# Patient Record
Sex: Male | Born: 1980 | Marital: Single | Smoking: Never smoker
Health system: Southern US, Community
[De-identification: ages and names within clinical notes are randomized; demographics above are authoritative.]

## PROBLEM LIST (undated history)

## (undated) DIAGNOSIS — I10 Essential (primary) hypertension: Secondary | ICD-10-CM

## (undated) DIAGNOSIS — R443 Hallucinations, unspecified: Secondary | ICD-10-CM

## (undated) DIAGNOSIS — F609 Personality disorder, unspecified: Secondary | ICD-10-CM

## (undated) DIAGNOSIS — G4700 Insomnia, unspecified: Secondary | ICD-10-CM

## (undated) DIAGNOSIS — R440 Auditory hallucinations: Secondary | ICD-10-CM

## (undated) DIAGNOSIS — F250 Schizoaffective disorder, bipolar type: Secondary | ICD-10-CM

## (undated) DIAGNOSIS — F29 Unspecified psychosis not due to a substance or known physiological condition: Secondary | ICD-10-CM

## (undated) DIAGNOSIS — R4586 Emotional lability: Secondary | ICD-10-CM

## (undated) DIAGNOSIS — E669 Obesity, unspecified: Secondary | ICD-10-CM

## (undated) DIAGNOSIS — E785 Hyperlipidemia, unspecified: Secondary | ICD-10-CM

## (undated) DIAGNOSIS — Z7289 Other problems related to lifestyle: Secondary | ICD-10-CM

## (undated) HISTORY — DX: Obesity, unspecified: E66.9

## (undated) HISTORY — DX: Schizoaffective disorder, bipolar type: F25.0

## (undated) HISTORY — DX: Hyperlipidemia, unspecified: E78.5

## (undated) HISTORY — DX: Emotional lability: R45.86

## (undated) HISTORY — DX: Personality disorder, unspecified: F60.9

## (undated) HISTORY — DX: Auditory hallucinations: R44.0

## (undated) HISTORY — DX: Essential (primary) hypertension: I10

## (undated) HISTORY — DX: Insomnia, unspecified: G47.00

## (undated) HISTORY — DX: Unspecified psychosis not due to a substance or known physiological condition: F29

## (undated) HISTORY — DX: Hallucinations, unspecified: R44.3

## (undated) HISTORY — DX: Other problems related to lifestyle: Z72.89

---

## 2003-10-26 HISTORY — PX: CIRCUMCISION: SUR203

## 2012-01-01 ENCOUNTER — Emergency Department: Payer: Self-pay | Admitting: Internal Medicine

## 2012-01-01 LAB — COMPREHENSIVE METABOLIC PANEL
Albumin: 3.9 g/dL (ref 3.4–5.0)
BUN: 8 mg/dL (ref 7–18)
Bilirubin,Total: 0.3 mg/dL (ref 0.2–1.0)
Chloride: 107 mmol/L (ref 98–107)
Creatinine: 1.18 mg/dL (ref 0.60–1.30)
Glucose: 102 mg/dL — ABNORMAL HIGH (ref 65–99)
SGOT(AST): 50 U/L — ABNORMAL HIGH (ref 15–37)
SGPT (ALT): 73 U/L
Total Protein: 8.1 g/dL (ref 6.4–8.2)

## 2012-01-01 LAB — SALICYLATE LEVEL: Salicylates, Serum: <1.7 mg/dL

## 2012-01-01 LAB — CBC
HGB: 16.4 g/dL (ref 13.0–18.0)
MCHC: 33.5 g/dL (ref 32.0–36.0)
RDW: 13.5 % (ref 11.5–14.5)
WBC: 6.9 10*3/uL (ref 3.8–10.6)

## 2012-01-01 LAB — TSH: Thyroid Stimulating Horm: 1.47 u[IU]/mL

## 2012-01-02 LAB — DIFFERENTIAL
Basophil #: 0 10*3/uL (ref 0.0–0.1)
Basophil %: 0.3 %
Eosinophil #: 0.8 10*3/uL — ABNORMAL HIGH (ref 0.0–0.7)
Lymphocyte %: 27.4 %
Monocyte #: 0.5 10*3/uL (ref 0.0–0.7)
Neutrophil #: 2.7 10*3/uL (ref 1.4–6.5)
Neutrophil %: 49 %

## 2012-01-02 LAB — DRUG SCREEN, URINE
Amphetamines, Ur Screen: NEGATIVE (ref ?–1000)
Cannabinoid 50 Ng, Ur ~~LOC~~: NEGATIVE (ref ?–50)
Cocaine Metabolite,Ur ~~LOC~~: NEGATIVE (ref ?–300)
MDMA (Ecstasy)Ur Screen: NEGATIVE (ref ?–500)
Opiate, Ur Screen: NEGATIVE (ref ?–300)
Phencyclidine (PCP) Ur S: NEGATIVE (ref ?–25)

## 2012-01-02 LAB — URINALYSIS, COMPLETE
Ketone: NEGATIVE
Leukocyte Esterase: NEGATIVE
Ph: 6 (ref 4.5–8.0)
Protein: NEGATIVE
Specific Gravity: 1.021 (ref 1.003–1.030)
Squamous Epithelial: <1
WBC UR: <1 /HPF (ref 0–5)

## 2012-01-02 LAB — WBC: WBC: 5.6 10*3/uL (ref 3.8–10.6)

## 2012-01-07 LAB — CULTURE, BLOOD (SINGLE)

## 2012-01-16 DIAGNOSIS — I509 Heart failure, unspecified: Secondary | ICD-10-CM

## 2012-01-19 DIAGNOSIS — R9431 Abnormal electrocardiogram [ECG] [EKG]: Secondary | ICD-10-CM

## 2012-01-19 LAB — CBC
HCT: 48.3 % (ref 40.0–52.0)
HGB: 16.2 g/dL (ref 13.0–18.0)
Platelet: 227 10*3/uL (ref 150–440)
RBC: 5.42 10*6/uL (ref 4.40–5.90)
RDW: 14.2 % (ref 11.5–14.5)
WBC: 5 10*3/uL (ref 3.8–10.6)

## 2012-01-19 LAB — DIFFERENTIAL
Basophil %: 0.3 %
Eosinophil %: 11 %
Lymphocyte #: 1.2 10*3/uL (ref 1.0–3.6)
Lymphocyte %: 24.3 %
Neutrophil #: 2.8 10*3/uL (ref 1.4–6.5)
Neutrophil %: 55.3 %

## 2012-01-19 LAB — COMPREHENSIVE METABOLIC PANEL
Anion Gap: 10 (ref 7–16)
BUN: 9 mg/dL (ref 7–18)
Bilirubin,Total: 0.4 mg/dL (ref 0.2–1.0)
Calcium, Total: 8.4 mg/dL — ABNORMAL LOW (ref 8.5–10.1)
Co2: 25 mmol/L (ref 21–32)
Creatinine: 1.32 mg/dL — ABNORMAL HIGH (ref 0.60–1.30)
EGFR (African American): >60
EGFR (Non-African Amer.): >60
Osmolality: 277 (ref 275–301)
Potassium: 4.2 mmol/L (ref 3.5–5.1)
SGPT (ALT): 60 U/L
Sodium: 140 mmol/L (ref 136–145)
Total Protein: 7.8 g/dL (ref 6.4–8.2)

## 2012-01-19 LAB — DRUG SCREEN, URINE
Cannabinoid 50 Ng, Ur ~~LOC~~: NEGATIVE (ref ?–50)
Cocaine Metabolite,Ur ~~LOC~~: NEGATIVE (ref ?–300)
MDMA (Ecstasy)Ur Screen: POSITIVE (ref ?–500)
Opiate, Ur Screen: NEGATIVE (ref ?–300)

## 2012-01-19 LAB — ETHANOL: Ethanol: <3 mg/dL

## 2012-01-21 LAB — LIPID PANEL
Cholesterol: 196 mg/dL (ref 0–200)
Triglycerides: 161 mg/dL (ref 0–200)
VLDL Cholesterol, Calc: 32 mg/dL (ref 5–40)

## 2012-01-21 LAB — FOLATE: Folic Acid: 8.7 ng/mL (ref 3.1–100.0)

## 2012-01-26 ENCOUNTER — Inpatient Hospital Stay: Payer: Self-pay | Admitting: Psychiatry

## 2012-01-26 LAB — DIFFERENTIAL
Basophil #: 0.1 10*3/uL (ref 0.0–0.1)
Eosinophil #: 0.8 10*3/uL — ABNORMAL HIGH (ref 0.0–0.7)
Lymphocyte #: 1.9 10*3/uL (ref 1.0–3.6)
Monocyte #: 0.6 10*3/uL (ref 0.0–0.7)
Monocyte %: 10.3 %
Neutrophil #: 2.3 10*3/uL (ref 1.4–6.5)

## 2012-01-26 LAB — WBC: WBC: 5.7 10*3/uL (ref 3.8–10.6)

## 2012-02-02 LAB — DIFFERENTIAL
Basophil %: 0.5 %
Eosinophil %: 13.6 %
Lymphocyte %: 29.4 %
Neutrophil #: 2.9 10*3/uL (ref 1.4–6.5)

## 2012-02-17 ENCOUNTER — Encounter: Payer: Self-pay | Admitting: *Deleted

## 2012-02-23 ENCOUNTER — Emergency Department: Payer: Self-pay | Admitting: Emergency Medicine

## 2012-02-23 LAB — CBC
HCT: 45.8 % (ref 40.0–52.0)
HGB: 15.6 g/dL (ref 13.0–18.0)
MCHC: 34.1 g/dL (ref 32.0–36.0)
MCV: 89 fL (ref 80–100)
RBC: 5.14 10*6/uL (ref 4.40–5.90)
RDW: 15.5 % — ABNORMAL HIGH (ref 11.5–14.5)
WBC: 7.9 10*3/uL (ref 3.8–10.6)

## 2012-02-23 LAB — COMPREHENSIVE METABOLIC PANEL
Anion Gap: 10 (ref 7–16)
Calcium, Total: 9 mg/dL (ref 8.5–10.1)
EGFR (African American): >60
EGFR (Non-African Amer.): >60
Glucose: 92 mg/dL (ref 65–99)
Osmolality: 278 (ref 275–301)
Potassium: 3.9 mmol/L (ref 3.5–5.1)
SGOT(AST): 24 U/L (ref 15–37)
Sodium: 140 mmol/L (ref 136–145)
Total Protein: 8.3 g/dL — ABNORMAL HIGH (ref 6.4–8.2)

## 2012-02-23 LAB — URINALYSIS, COMPLETE
Bacteria: NONE SEEN
Glucose,UR: NEGATIVE mg/dL (ref 0–75)
Nitrite: NEGATIVE
Ph: 5 (ref 4.5–8.0)
Protein: NEGATIVE
RBC,UR: <1 /HPF (ref 0–5)
Specific Gravity: 1.02 (ref 1.003–1.030)

## 2012-02-23 LAB — VALPROIC ACID LEVEL: Valproic Acid: 61 ug/mL

## 2012-02-23 LAB — DRUG SCREEN, URINE
Amphetamines, Ur Screen: NEGATIVE (ref ?–1000)
Barbiturates, Ur Screen: NEGATIVE (ref ?–200)
Cannabinoid 50 Ng, Ur ~~LOC~~: POSITIVE (ref ?–50)
Cocaine Metabolite,Ur ~~LOC~~: NEGATIVE (ref ?–300)
Methadone, Ur Screen: NEGATIVE (ref ?–300)
Opiate, Ur Screen: NEGATIVE (ref ?–300)
Phencyclidine (PCP) Ur S: NEGATIVE (ref ?–25)
Tricyclic, Ur Screen: NEGATIVE (ref ?–1000)

## 2012-02-23 LAB — DIFFERENTIAL
Basophil #: 0.1 10*3/uL (ref 0.0–0.1)
Basophil %: 1 %
Lymphocyte #: 1.7 10*3/uL (ref 1.0–3.6)
Monocyte #: 0.9 x10 3/mm (ref 0.2–1.0)
Monocyte %: 11.1 %
Neutrophil #: 4.9 10*3/uL (ref 1.4–6.5)

## 2012-03-01 ENCOUNTER — Encounter: Payer: Self-pay | Admitting: Cardiovascular Disease

## 2012-03-06 ENCOUNTER — Emergency Department: Payer: Self-pay | Admitting: Emergency Medicine

## 2012-03-06 LAB — DRUG SCREEN, URINE
Benzodiazepine, Ur Scrn: NEGATIVE (ref ?–200)
Cannabinoid 50 Ng, Ur ~~LOC~~: NEGATIVE (ref ?–50)
Cocaine Metabolite,Ur ~~LOC~~: NEGATIVE (ref ?–300)
MDMA (Ecstasy)Ur Screen: POSITIVE (ref ?–500)
Opiate, Ur Screen: NEGATIVE (ref ?–300)
Phencyclidine (PCP) Ur S: NEGATIVE (ref ?–25)
Tricyclic, Ur Screen: NEGATIVE (ref ?–1000)

## 2012-03-06 LAB — COMPREHENSIVE METABOLIC PANEL
Alkaline Phosphatase: 130 U/L (ref 50–136)
Anion Gap: 8 (ref 7–16)
BUN: 11 mg/dL (ref 7–18)
Bilirubin,Total: 0.3 mg/dL (ref 0.2–1.0)
Creatinine: 1.31 mg/dL — ABNORMAL HIGH (ref 0.60–1.30)
Glucose: 83 mg/dL (ref 65–99)
Osmolality: 278 (ref 275–301)
SGOT(AST): 27 U/L (ref 15–37)
SGPT (ALT): 30 U/L
Sodium: 140 mmol/L (ref 136–145)
Total Protein: 8.6 g/dL — ABNORMAL HIGH (ref 6.4–8.2)

## 2012-03-06 LAB — CBC
HGB: 15.8 g/dL (ref 13.0–18.0)
MCH: 29.8 pg (ref 26.0–34.0)
MCHC: 33.2 g/dL (ref 32.0–36.0)
MCV: 90 fL (ref 80–100)
Platelet: 317 10*3/uL (ref 150–440)
WBC: 7.7 10*3/uL (ref 3.8–10.6)

## 2012-03-06 LAB — VALPROIC ACID LEVEL: Valproic Acid: 61 ug/mL

## 2012-03-06 LAB — ETHANOL
Ethanol %: <0.003 % (ref 0.000–0.080)
Ethanol: <3 mg/dL

## 2012-03-07 LAB — DIFFERENTIAL
Basophil #: 0 10*3/uL (ref 0.0–0.1)
Basophil %: 0.4 %
Eosinophil #: 0.6 10*3/uL (ref 0.0–0.7)
Eosinophil %: 7.5 %
Lymphocyte #: 1.7 10*3/uL (ref 1.0–3.6)
Monocyte #: 0.9 x10 3/mm (ref 0.2–1.0)
Monocyte %: 11.3 %
Neutrophil #: 4.5 10*3/uL (ref 1.4–6.5)

## 2012-03-08 LAB — LIPID PANEL
Cholesterol: 141 mg/dL (ref 0–200)
HDL Cholesterol: 25 mg/dL — ABNORMAL LOW (ref 40–60)
Triglycerides: 188 mg/dL (ref 0–200)

## 2012-03-13 LAB — DIFFERENTIAL
Basophil #: 0 10*3/uL (ref 0.0–0.1)
Basophil %: 0.9 %
Eosinophil #: 0.7 10*3/uL (ref 0.0–0.7)
Eosinophil %: 12.9 %
Monocyte %: 11.6 %
Neutrophil #: 2.9 10*3/uL (ref 1.4–6.5)
Neutrophil %: 54.3 %

## 2012-03-14 LAB — COMPREHENSIVE METABOLIC PANEL
Albumin: 3.6 g/dL (ref 3.4–5.0)
Alkaline Phosphatase: 116 U/L (ref 50–136)
Anion Gap: 11 (ref 7–16)
BUN: 11 mg/dL (ref 7–18)
Bilirubin,Total: 0.2 mg/dL (ref 0.2–1.0)
Calcium, Total: 8.8 mg/dL (ref 8.5–10.1)
Chloride: 104 mmol/L (ref 98–107)
Co2: 22 mmol/L (ref 21–32)
EGFR (Non-African Amer.): >60
Glucose: 138 mg/dL — ABNORMAL HIGH (ref 65–99)
Osmolality: 275 (ref 275–301)
Potassium: 4.1 mmol/L (ref 3.5–5.1)
SGOT(AST): 25 U/L (ref 15–37)
SGPT (ALT): 30 U/L
Sodium: 137 mmol/L (ref 136–145)

## 2012-03-17 LAB — BASIC METABOLIC PANEL
Anion Gap: 8 (ref 7–16)
BUN: 11 mg/dL (ref 7–18)
Creatinine: 1.38 mg/dL — ABNORMAL HIGH (ref 0.60–1.30)
EGFR (African American): >60
Potassium: 4.3 mmol/L (ref 3.5–5.1)
Sodium: 136 mmol/L (ref 136–145)

## 2014-01-16 IMAGING — CR DG CHEST 2V
1 series · 2 of 2 positions shown · non-contrast
Comparison: none

REASON FOR EXAM: cough fever
COMMENTS:

PROCEDURE:     DXR - DXR CHEST PA (OR AP) AND LATERAL  - January 01, 2012  [DATE]
RESULT:     The lungs are clear. The cardiac silhouette and visualized bony
skeleton are unremarkable.

[Series 1: w chest pa · 0.14mm/px · 2 of 2 slices shown]
[im 1/2]
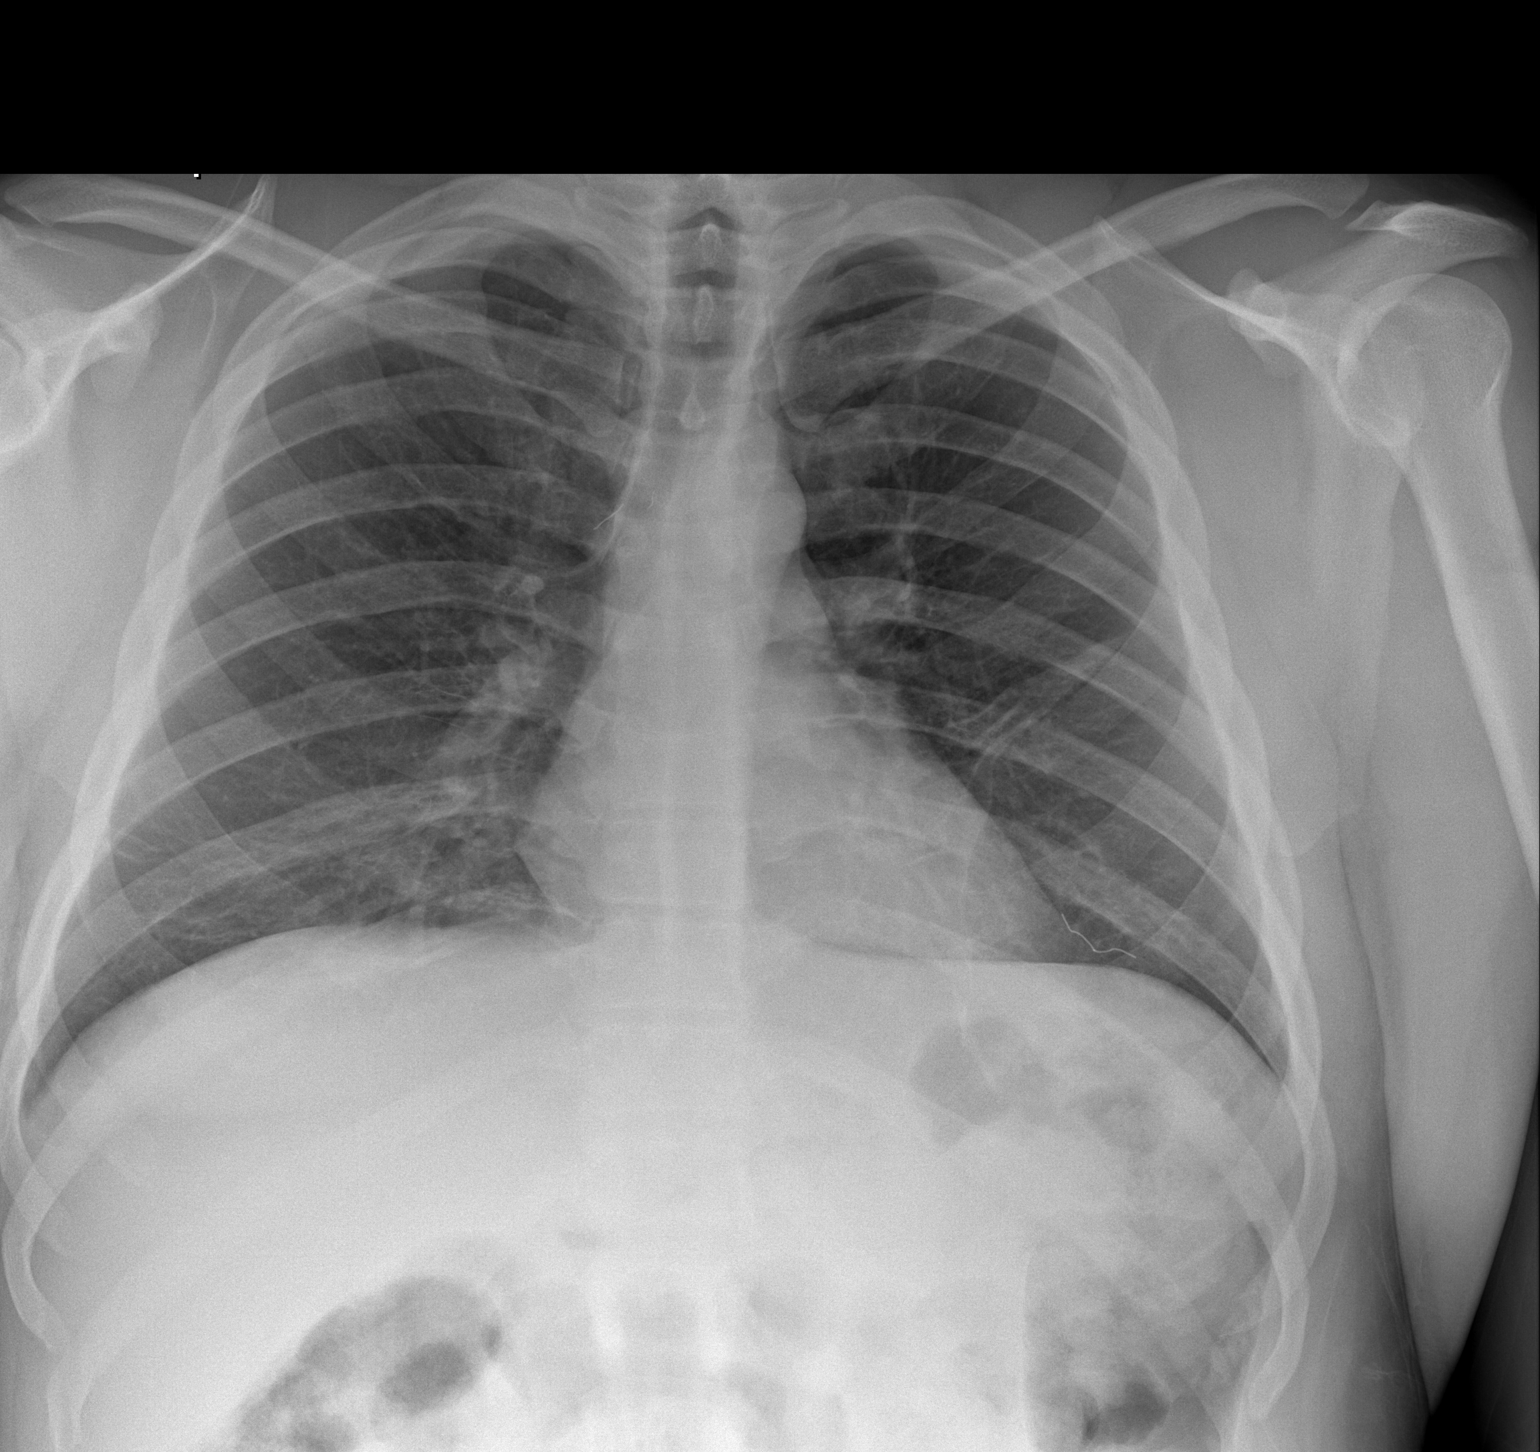
[im 2/2]
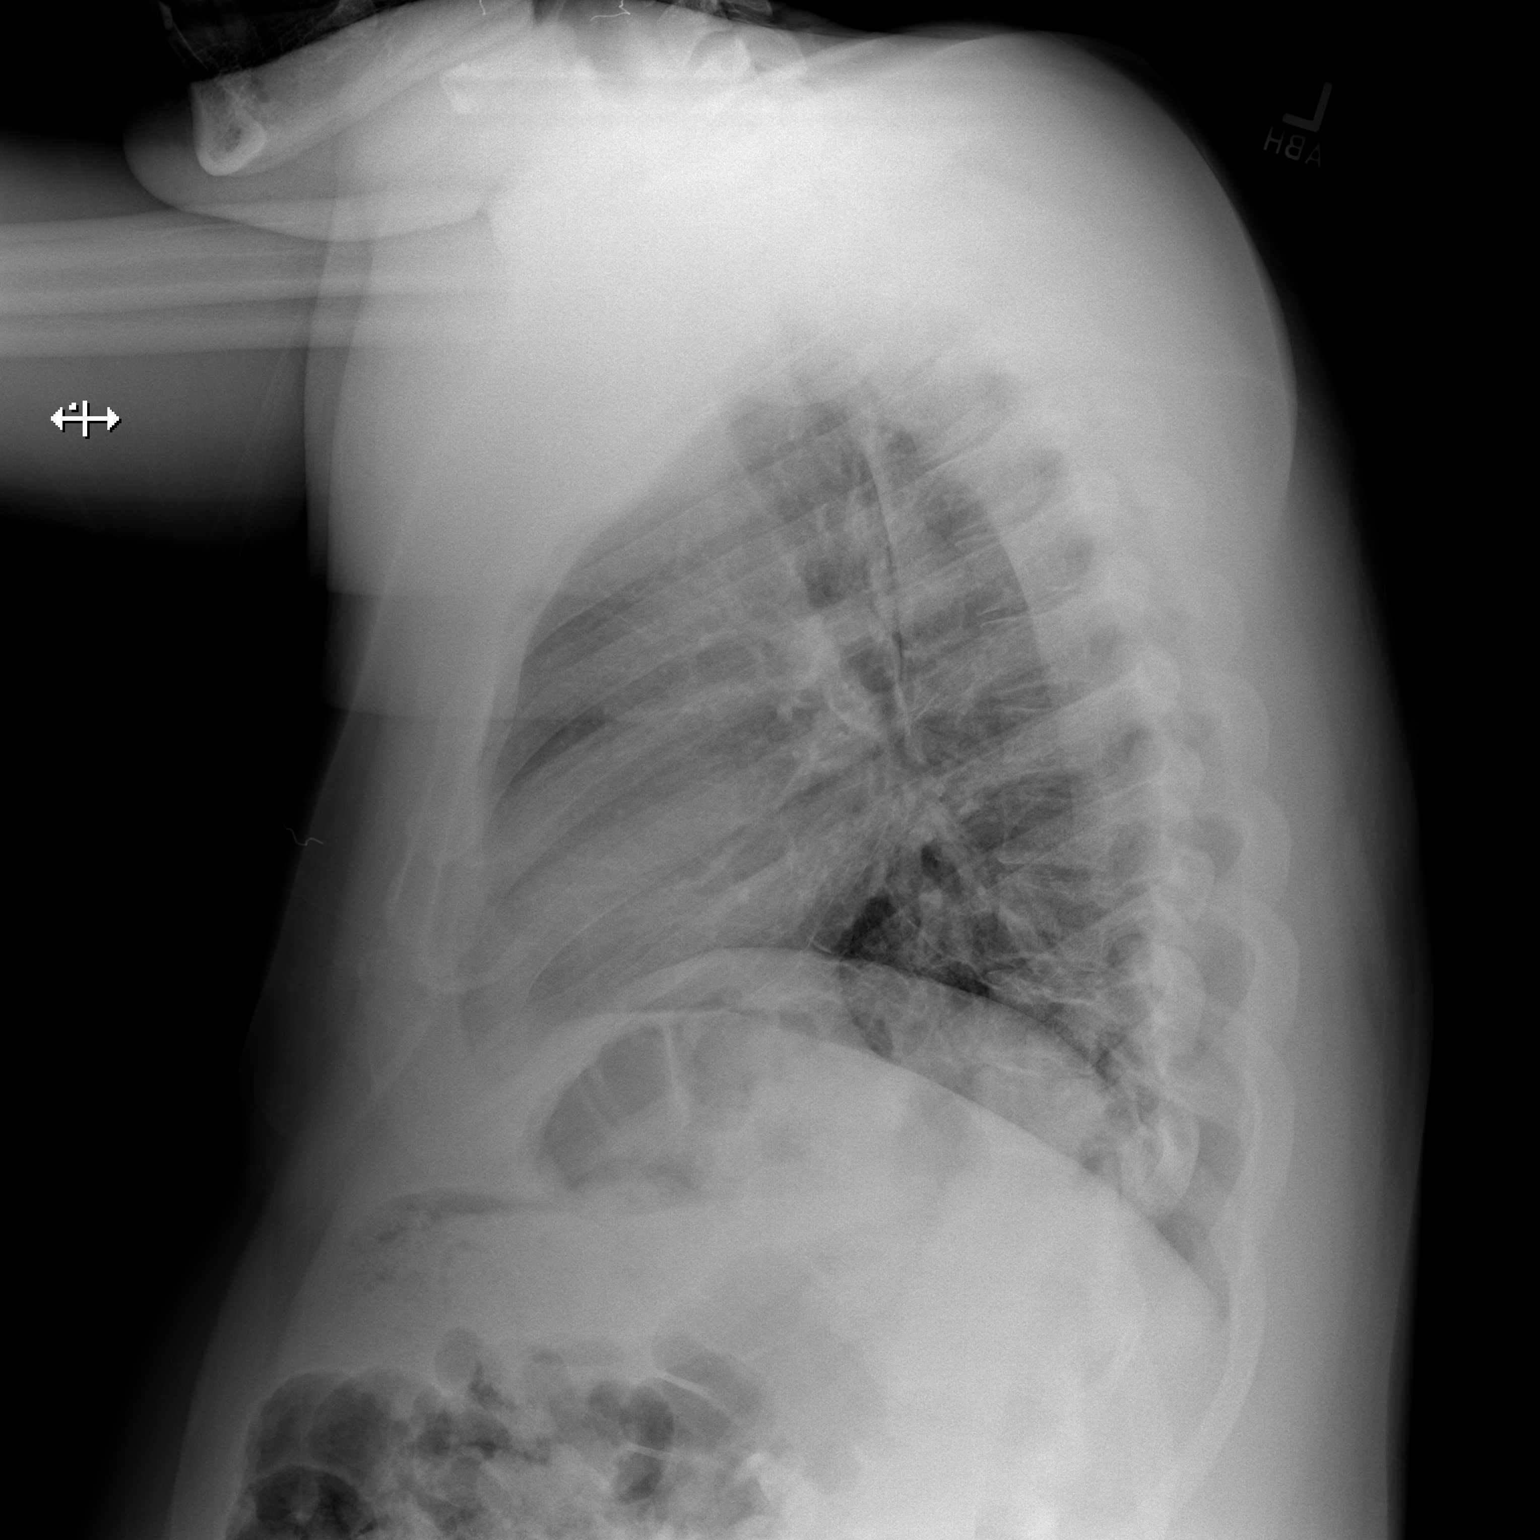

[2 of 2 positions shown; findings below may reference images not displayed]

IMPRESSION: 1. Chest radiograph without evidence of acute cardiopulmonary disease.
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
